# Patient Record
Sex: Male | Born: 1960 | Race: White | Hispanic: No | Marital: Married
Health system: Southern US, Community
[De-identification: ages and names within clinical notes are randomized; demographics above are authoritative.]

## PROBLEM LIST (undated history)

## (undated) DIAGNOSIS — G039 Meningitis, unspecified: Secondary | ICD-10-CM

## (undated) DIAGNOSIS — K589 Irritable bowel syndrome without diarrhea: Secondary | ICD-10-CM

---

## 1999-10-24 ENCOUNTER — Encounter: Admission: RE | Admit: 1999-10-24 | Discharge: 1999-10-24 | Payer: Self-pay | Admitting: Internal Medicine

## 1999-10-24 ENCOUNTER — Encounter: Payer: Self-pay | Admitting: Internal Medicine

## 2004-10-26 ENCOUNTER — Ambulatory Visit (HOSPITAL_COMMUNITY): Admission: RE | Admit: 2004-10-26 | Discharge: 2004-10-26 | Payer: Self-pay | Admitting: Internal Medicine

## 2006-05-25 ENCOUNTER — Emergency Department (HOSPITAL_COMMUNITY): Admission: EM | Admit: 2006-05-25 | Discharge: 2006-05-25 | Payer: Self-pay | Admitting: Emergency Medicine

## 2009-01-09 ENCOUNTER — Emergency Department (HOSPITAL_COMMUNITY): Admission: EM | Admit: 2009-01-09 | Discharge: 2009-01-09 | Payer: Self-pay | Admitting: Emergency Medicine

## 2011-04-16 LAB — CBC
HCT: 44.4 % (ref 39.0–52.0)
Hemoglobin: 14.7 g/dL (ref 13.0–17.0)
MCHC: 33.1 g/dL (ref 30.0–36.0)
MCV: 95.5 fL (ref 78.0–100.0)
Platelets: 145 10*3/uL — ABNORMAL LOW (ref 150–400)
RBC: 4.65 MIL/uL (ref 4.22–5.81)
RDW: 13.2 % (ref 11.5–15.5)
WBC: 6.8 10*3/uL (ref 4.0–10.5)

## 2011-04-16 LAB — BASIC METABOLIC PANEL
BUN: 9 mg/dL (ref 6–23)
CO2: 25 mEq/L (ref 19–32)
Calcium: 9.6 mg/dL (ref 8.4–10.5)
Chloride: 103 mEq/L (ref 96–112)
Creatinine, Ser: 0.97 mg/dL (ref 0.4–1.5)
GFR calc Af Amer: >60 mL/min (ref >60)
GFR calc non Af Amer: >60 mL/min (ref >60)
Glucose, Bld: 108 mg/dL — ABNORMAL HIGH (ref 70–99)
Potassium: 3.5 mEq/L (ref 3.5–5.1)
Sodium: 137 mEq/L (ref 135–145)

## 2011-04-16 LAB — DIFFERENTIAL
Basophils Absolute: 0.1 10*3/uL (ref 0.0–0.1)
Eosinophils Relative: 2 % (ref 0–5)
Lymphocytes Relative: 35 % (ref 12–46)
Lymphs Abs: 2.4 10*3/uL (ref 0.7–4.0)
Monocytes Absolute: 0.6 10*3/uL (ref 0.1–1.0)

## 2011-05-18 NOTE — H&P (Signed)
NAME:  Jason Richardson, Jason Richardson NO.:  [id number]   MEDICAL RECORD NO.:  [id number]          PATIENT TYPE:  EMS   LOCATION:  MAJO                         FACILITY:  [facility]   PHYSICIAN:  Hal T. Stoneking, M.D. DATE OF BIRTH:  10-13-1961   DATE OF ADMISSION:  05/25/2006  DATE OF DISCHARGE:                                HISTORY & PHYSICAL   HISTORY OF PRESENT ILLNESS:  Mr. Jason Richardson is a very nice 50 year old white  male patient of Dr. Kirby Richardson.  He has been in excellent health.  He has  a history he states in the past of having had a 2-D echocardiogram done and  they told him that one of his valves did not shut all the way.  There was  no needed followup on this particular matter.  He states that he has had  symptoms apparently which have not been related to his position in over the  last couple of years he notices when he mows the lawn he would get  discomfort in his left arm.  He will wait maybe 30 seconds, stop what he is  doing and the discomfort will go away.  Then recently he noted an episode  yesterday when he was walking along side the table and later found himself  sitting on the table and had no recollection of how he got to that position.  He had no fall.  He had no prior warning of rapid heart action,  palpitations, or chest pain at the time.  He says he has felt occasionally a  little lightheaded and when that occurs he has broken out in a sweat.  He  also complains of a periodic chest pressure although it is not clear as to  whether or not this was produced by exertion.  He comes to the ER because of  the episode yesterday and the chest pressure off an on today.  Currently, he  has no chest pain or dizziness.   PAST MEDICAL HISTORY:  No known drug allergies.   CURRENT MEDICATIONS:  None other than occasional Tylenol.   PAST MEDICAL HISTORY:  Negative for hypertension, diabetes and cancer.  Question history of valvular regurgitation.  He has had no prior  surgeries.   FAMILY HISTORY:  Remarkable for his mother, age 63, who has glaucoma.  She  had an MI at age 94 and now is treated for CHF.  Father had an MI at age 60  although he has lost contact with his father over the last year.  He has two  sisters who are in good health although they are overweight.   SOCIAL HISTORY:  Mr. Jason Richardson is single.  He has no children.  He works at  [employer].  He does not smoke.  He does not consume alcohol.   REVIEW OF SYSTEMS:  He occasionally has a headache for which he takes  Tylenol but no headache at the present time.  No change in vision, no  shortness of breath.  No abdominal pain.  No change in bowel or urinary  habits.  PHYSICAL EXAMINATION:  VITAL SIGNS:  Blood pressure 127/75, temperature  98.5, pulse 65 and regular, respiratory rate 20, O2 saturation 98%.  HEENT:  Unremarkable.  LUNGS:  Clear.  HEART:  Regular rate and rhythm without murmurs, rubs or gallops.  ABDOMEN:  Soft, no hepatosplenomegaly or pain.   LABORATORY DATA:  White count 4200, hemoglobin 14.2, platelet count 139,000.  Troponin less than 1.  CK reported out as negative with negative MB although  absolute values not currently available.  Sodium 139, potassium __________ ,  BUN 9, __________ , chloride 105.  EKG revealed incomplete right bundle  branch block, question new or old.   ASSESSMENT:  Subacute history of exertional left arm pain and occasional  question of exertional chest pressure, now with lightheadedness and  diaphoresis.  This amnestic episode, doubt that this was a true syncopal  episode as he had no evidence of fall or trauma.  There is, however, a  family history of heart disease and one would wonder whether or not he has  had some underlying coronary disease.  We will start on aspirin 325 mg once  a day and have cardiology evaluate to help [place] decide the most appropriate  manner to further investigate this  problem.           ______________________________  Sunday Spillers Pete Glatter, M.D.     HTS/MEDQ  D:  05/25/2006  T:  05/25/2006  Job:  [id number]

## 2011-05-18 NOTE — Consult Note (Signed)
NAME:  Jason Richardson, Jason Richardson NO.:  [id number]   MEDICAL RECORD NO.:  [id number]          PATIENT TYPE:  OBV   LOCATION:  1829                         FACILITY:  [facility]   PHYSICIAN:  Peter M. Swaziland, M.D.  DATE OF BIRTH:  December 09, 1961   DATE OF CONSULTATION:  05/25/2006  DATE OF DISCHARGE:                                   CONSULTATION   Mr. Jason Richardson is a pleasant 51 year old white male in generally excellent  health who presents to emergency room department complaining of some chest  pressure, lightheadedness today.  The patient describes a mid substernal  chest pressure off and on for the past year.  This is not exertionally  related.  He feels that if he could just take a good belch it would resolve  and in fact has resolved previously with belching. 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  He is  currently without symptoms.  He has no nausea, vomiting, diaphoresis, or  shortness of breath.  He does have a history of acid reflux disease,  apparently diagnosed by upper GI series in the past.   PAST MEDICAL HISTORY:  1.  GERD.  He has no prior history of hypertension, hypercholesterolemia or      diabetes.  2.  He is on no medications and has no known allergies.   SOCIAL HISTORY:  The patient is single, has no children.  He denies history  of tobacco or alcohol use.  He works in the Environmental consultant at [employer].   FAMILY HISTORY:  Mother has a history of lymphoma, pulmonary fibrosis and  cardiomyopathy related to chemotherapy.  His father apparently has some  history of heart disease in his 17s but sone is currently estranged from his  father and does not know his history well.   REVIEW OF SYSTEMS:  Otherwise unremarkable.  He has no orthopnea, PND, edema  and has had no recent travel.  No fever.  Denies any change in bowel or  bladder  habits.   PHYSICAL EXAM:  GENERAL:  Patient is a pleasant thin white male in no  apparent distress.  VITAL SIGNS:  Blood pressure 127/75, pulse 68, respirations 20, afebrile.  HEENT:  Normocephalic, atraumatic.  His pupils equal, round, reactive.  Sclerae clear.  Oropharynx is clear.  Neck is without JVD, adenopathy,  thyromegaly or bruits.  LUNGS:  Clear to auscultation and percussion.  CARDIAC:  Exam reveals regular rate and rhythm without gallop, murmur, rub  or click. 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  ABDOMEN:  Soft, nontender without as splenomegaly, masses or bruits.  Femoral and pedal pulses were 3+ and symmetric.  He had no phlebitis or  edema.  NEUROLOGIC:  Exam is nonfocal.   LABORATORY DATA:  CHEST X-RAY:  Shows hyperinflation with no active disease.  ECG shows sinus rhythm with incomplete right bundle branch block, otherwise  normal.   Point of care cardiac enzymes are negative x3. White count 4200,  hemoglobin  14.2, hematocrit 42.7, platelets 139,000.  Sodium is 139, potassium 3.8,  chloride 105, CO2 31, BUN 9, creatinine 1.0, glucose of 102.   IMPRESSION:  1.  Atypical chest pain with mild lightheadedness.  2.  GERD.   PLAN:  I think it would be prudent to let the patient go home today.  I  recommend that he take a baby aspirin per day.  May try taking Zantac or  Pepcid over-the-counter as needed.  We will arrange to come back to my  office later this week for a stress echo evaluation.  He is instructed if he  has any worsening chest pain that he come back emergency room this week end.           ______________________________  Peter M. [place], M.D.     PMJ/MEDQ  D:  05/25/2006  T:  05/27/2006  Job:  [id number]   cc:   Hal T. Stoneking, M.D.  Fax: [phone]   Thora Lance, M.D.  Fax: [phone]

## 2011-05-18 NOTE — Discharge Summary (Signed)
NAME:  Jason Richardson, Jason Richardson NO.:  [id number]   MEDICAL RECORD NO.:  [id number]          PATIENT TYPE:  EMS   LOCATION:  MAJO                         FACILITY:  [facility]   PHYSICIAN:  Hal T. Stoneking, M.D. DATE OF BIRTH:  1961/08/08   DATE OF ADMISSION:  05/25/2006  DATE OF DISCHARGE:  05/25/2006                                 DISCHARGE SUMMARY   ADMISSION DIAGNOSIS:  For 24-hour observation, chest pain, question  etiology.   DISCHARGE DIAGNOSES:  Chest pain, possibly gastroesophageal reflux disease.   BRIEF HISTORY:  Mr. [name] is a very nice 50 year old white male, patient of  Dr. Kirby Funk, who presented top the [facility] Emergency Room on May 25, 2006, with periodic chest pain and occasionally some episodes of feeling  lightheaded and breaking out into a sweat.   PHYSICAL EXAMINATION:  Entirely normal.   LABORATORY DATA:  Hemoglobin 14.2, white count 4200, platelet count 139,000.  Point-of-care cardiac enzymes were negative x3.  Sodium 139, potassium 3.8,  chloride 105, BUN 9, creatinine 1, glucose 102.   EKG showed sinus rhythm, incomplete right bundle branch block.   Chest x-ray showed hyperinflation, no active disease.   HOSPITAL COURSE:  The patient was admitted for 24-hour observation and  cardiac consultation.  He was seen in the emergency room by Dr. Peter  Swaziland, and, after further evaluation, Dr. [place]iland felt that this was indeed  atypical chest pain, possibly due to GERD, but felt that he should be  discharged home on a baby aspirin a day, over-the-counter Zantac or Pepcid  as needed, and Dr. Sw[place]iland will be seeing him in his clinic to set up a  stress echocardiogram evaluation to rule out the possibility of coronary  ischemia.           ______________________________  Hal T. Pete Glatter, M.D.     HTS/MEDQ  D:  05/28/2006  T:  05/28/2006  Job:  [id number]

## 2012-07-17 ENCOUNTER — Emergency Department (HOSPITAL_COMMUNITY)
Admission: EM | Admit: 2012-07-17 | Discharge: 2012-07-17 | Disposition: A | Discharge status: Home / Self Care | Payer: 59 | Attending: Emergency Medicine | Admitting: Emergency Medicine

## 2012-07-17 ENCOUNTER — Encounter (HOSPITAL_COMMUNITY): Payer: Self-pay | Admitting: Emergency Medicine

## 2012-07-17 DIAGNOSIS — R5383 Other fatigue: Secondary | ICD-10-CM

## 2012-07-17 DIAGNOSIS — K589 Irritable bowel syndrome without diarrhea: Secondary | ICD-10-CM | POA: Insufficient documentation

## 2012-07-17 DIAGNOSIS — R5381 Other malaise: Secondary | ICD-10-CM | POA: Insufficient documentation

## 2012-07-17 HISTORY — DX: Irritable bowel syndrome without diarrhea: K58.9

## 2012-07-17 LAB — DIFFERENTIAL
Basophils Absolute: 0 10*3/uL (ref 0.0–0.1)
Basophils Relative: 1 % (ref 0–1)
Eosinophils Absolute: 0.1 10*3/uL (ref 0.0–0.7)
Eosinophils Relative: 1 % (ref 0–5)
Lymphocytes Relative: 14 % (ref 12–46)
Lymphs Abs: 1.1 10*3/uL (ref 0.7–4.0)
Monocytes Absolute: 0.4 10*3/uL (ref 0.1–1.0)
Monocytes Relative: 5 % (ref 3–12)
Neutro Abs: 6.4 10*3/uL (ref 1.7–7.7)
Neutrophils Relative %: 79 % — ABNORMAL HIGH (ref 43–77)

## 2012-07-17 LAB — COMPREHENSIVE METABOLIC PANEL
ALT: 26 U/L (ref 0–53)
AST: 34 U/L (ref 0–37)
Albumin: 4 g/dL (ref 3.5–5.2)
Alkaline Phosphatase: 52 U/L (ref 39–117)
BUN: 10 mg/dL (ref 6–23)
CO2: 25 mEq/L (ref 19–32)
Calcium: 9 mg/dL (ref 8.4–10.5)
Chloride: 105 mEq/L (ref 96–112)
Creatinine, Ser: 0.86 mg/dL (ref 0.50–1.35)
GFR calc Af Amer: >90 mL/min (ref >90)
GFR calc non Af Amer: >90 mL/min (ref >90)
Glucose, Bld: 99 mg/dL (ref 70–99)
Potassium: 4.1 mEq/L (ref 3.5–5.1)
Sodium: 140 mEq/L (ref 135–145)
Total Bilirubin: 0.5 mg/dL (ref 0.3–1.2)
Total Protein: 6.8 g/dL (ref 6.0–8.3)

## 2012-07-17 LAB — URINALYSIS, ROUTINE W REFLEX MICROSCOPIC
Bilirubin Urine: NEGATIVE
Glucose, UA: NEGATIVE mg/dL
Hgb urine dipstick: NEGATIVE
Ketones, ur: NEGATIVE mg/dL
Leukocytes, UA: NEGATIVE
Nitrite: NEGATIVE
Protein, ur: NEGATIVE mg/dL
Specific Gravity, Urine: 1.011 (ref 1.005–1.030)
Urobilinogen, UA: 1 mg/dL (ref 0.0–1.0)
pH: 8.5 — ABNORMAL HIGH (ref 5.0–8.0)

## 2012-07-17 LAB — GLUCOSE, CAPILLARY: Glucose-Capillary: 79 mg/dL (ref 70–99)

## 2012-07-17 LAB — CBC
HCT: 40.2 % (ref 39.0–52.0)
MCHC: 34.6 g/dL (ref 30.0–36.0)
MCV: 91.8 fL (ref 78.0–100.0)
RDW: 13.1 % (ref 11.5–15.5)

## 2012-07-17 MED ORDER — SODIUM CHLORIDE 0.9 % IV SOLN
1000.0000 mL | Freq: Once | INTRAVENOUS | Status: AC
  Administered 2012-07-17: 1000 mL via INTRAVENOUS

## 2012-07-17 MED ORDER — SODIUM CHLORIDE 0.9 % IV SOLN
1000.0000 mL | INTRAVENOUS | Status: DC
  Administered 2012-07-17: 1000 mL via INTRAVENOUS

## 2012-07-17 NOTE — ED Provider Notes (Signed)
History     CSN: [id number]  Arrival date & time 07/17/12  1022   First MD Initiated Contact with Patient 07/17/12 1024      Chief Complaint  Patient presents with  . Fatigue  . Abdominal Pain    (Consider location/radiation/quality/duration/timing/severity/associated sxs/prior treatment) HPI Comments: Pt was at work today when he suddenly started feeling weak and lightheaded.  He denies chest pain.  He did feel like his neck was a little stiff this am when he woke up.  He had not been doing anything strenuous.  He felt felt very sweaty.  He did not feel hot.  Pt bent over and he felt an ache in his abdomen.  He has history of Acid reflux problems and IBS.  He has had workup with colonscopy and endoscopy (which were normal)  Patient is a 51 y.o. male presenting with weakness.  Weakness The primary symptoms include dizziness. Primary symptoms do not include vomiting. The symptoms began less than 1 hour ago. The symptoms are improving. The neurological symptoms are diffuse.  He describes the dizziness as lightheadedness. The dizziness has been gradually improving since its onset. It is a new problem. Dizziness also occurs with weakness and diaphoresis. Dizziness does not occur with blurred vision or vomiting.  Additional symptoms include weakness.    Past Medical History  Diagnosis Date  . IBS (irritable bowel syndrome)     No past surgical history on file.  No family history on file.  History  Substance Use Topics  . Smoking status: Not on file  . Smokeless tobacco: Not on file  . Alcohol Use:       Review of Systems  Constitutional: Positive for diaphoresis.  Eyes: Negative for blurred vision.  Gastrointestinal: Negative for vomiting and diarrhea.  Genitourinary: Negative for dysuria.  Neurological: Positive for dizziness and weakness.  Psychiatric/Behavioral: Negative for confusion.  All other systems reviewed and are negative.    Allergies  Review of patient's  allergies indicates not on file.  Home Medications  No current outpatient prescriptions on file.  SpO2 96%  Physical Exam  Nursing note and vitals reviewed. Constitutional: No distress.       thin  HENT:  Head: Normocephalic and atraumatic.  Right Ear: External ear normal.  Left Ear: External ear normal.  Eyes: Conjunctivae are normal. Right eye exhibits no discharge. Left eye exhibits no discharge. No scleral icterus.  Neck: Neck supple. No tracheal deviation present.  Cardiovascular: Normal rate, regular rhythm and intact distal pulses.   Pulmonary/Chest: Effort normal and breath sounds normal. No stridor. No respiratory distress. He has no wheezes. He has no rales.  Abdominal: Soft. Bowel sounds are normal. He exhibits no distension. There is no tenderness. There is no rebound and no guarding.  Musculoskeletal: He exhibits no edema and no tenderness.  Neurological: He is alert. He has normal strength. No sensory deficit. Cranial nerve deficit:  no gross defecits noted. He exhibits normal muscle tone. He displays no seizure activity. Coordination normal.  Skin: Skin is warm and dry. No rash noted.  Psychiatric: He has a normal mood and affect.    ED Course  Procedures (including critical care time) EKG Rate 49 AGE IS NOT ENTERED, ASSUMED TO BE 51 YEARS OLD FOR PURPOSE OF ECG INTERPRETATION SINUS RHYTHM ~ normal P axis, V-rate 50- 99 BORDERLINE AV CONDUCTION DELAY ~ PR >200, V-rate 50- 90 PROBABLE LEFT ATRIAL ABNORMALITY ~ P >12mS, <-0.76mV V1 INCOMPLETE RIGHT BUNDLE BRANCH BLOCK ~ QRSd >105,  terminal axis(90,270) No sig change compared to previous Labs Reviewed  CBC - Abnormal; Notable for the following:    Platelets 138 (*)     All other components within normal limits  DIFFERENTIAL - Abnormal; Notable for the following:    Neutrophils Relative 79 (*)     All other components within normal limits  URINALYSIS, ROUTINE W REFLEX MICROSCOPIC - Abnormal; Notable for the  following:    pH 8.5 (*)     All other components within normal limits  COMPREHENSIVE METABOLIC PANEL  LIPASE, BLOOD  GLUCOSE, CAPILLARY  POCT CBG (FASTING - GLUCOSE)-MANUAL ENTRY   No results found.   1. Fatigue       MDM  Patient had a near syncopal episode at work. After IV fluid hydration he has improved. Patient states he was working out in the heat it is possible this was related. At this time does not appear to be evidence of an acute emergency medical condition. I doubt cardiac etiology. He does not appear to be anemic and there is no signs of acute infection. I recommend patient follow up with his primary care Dr. Consider further outpatient workup such as echocardiogram and Holter monitor testing. Patient encouraged to return to emergency room for any worsening symptoms        Celene Kras, MD 07/17/12 1349

## 2012-07-17 NOTE — ED Notes (Signed)
Patient at work standing sudden onset of weakness pale abdominal pain, and neck pain.  History of IBS CBG 91.

## 2012-12-01 ENCOUNTER — Other Ambulatory Visit: Payer: Self-pay | Admitting: Internal Medicine

## 2012-12-01 DIAGNOSIS — R1013 Epigastric pain: Secondary | ICD-10-CM

## 2012-12-05 ENCOUNTER — Ambulatory Visit
Admission: RE | Admit: 2012-12-05 | Discharge: 2012-12-05 | Disposition: A | Discharge status: Home / Self Care | Payer: 59 | Source: Ambulatory Visit | Attending: Internal Medicine | Admitting: Internal Medicine

## 2012-12-05 DIAGNOSIS — R1013 Epigastric pain: Secondary | ICD-10-CM

## 2012-12-05 MED ORDER — IOHEXOL 300 MG/ML  SOLN
100.0000 mL | Freq: Once | INTRAMUSCULAR | Status: AC | PRN
  Administered 2012-12-05: 100 mL via INTRAVENOUS

## 2013-11-01 IMAGING — CT CT ABDOMEN W/ CM
3 of 5 series · 13 of 36 positions shown, 19 images · IV contrast (READICAT/WATER & [ID] OMNI 300)
Comparison: None.

CLINICAL DATA: Epigastric abdominal pain, diarrhea, possible
irritable bowel syndrome

CT ABDOMEN WITH CONTRAST
TECHNIQUE: Multidetector CT imaging of the abdomen was performed
following the standard protocol during bolus administration of
intravenous contrast.
Contrast: 100mL OMNIPAQUE IOHEXOL 300 MG/ML  SOLN

[Series 3: abdomen with · axial · 0.70mm/px · z∈[-301,-111]mm · 5 of 58 slices shown, 10 images]
[im 10/58  soft-tissue]
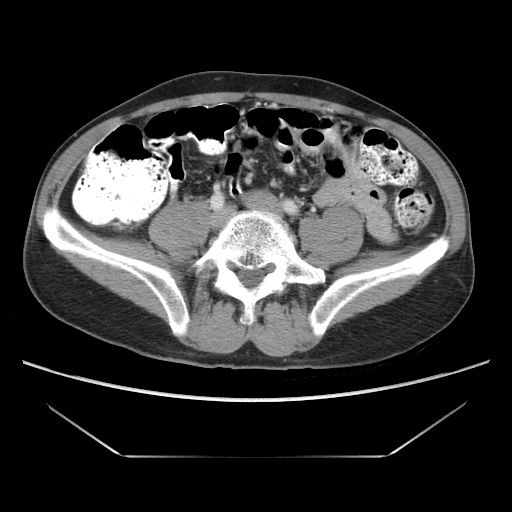
[im 10/58  bone]
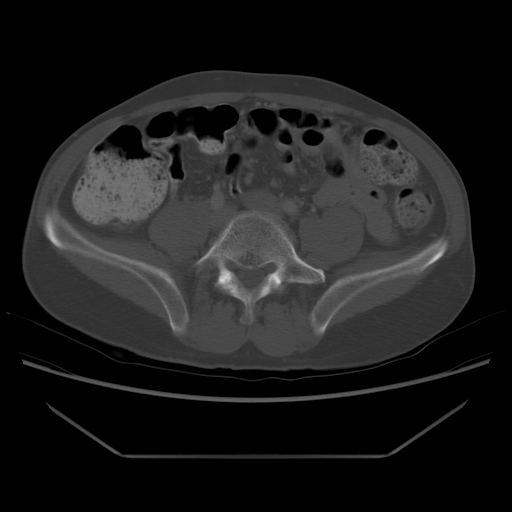
[im 20/58  soft-tissue]
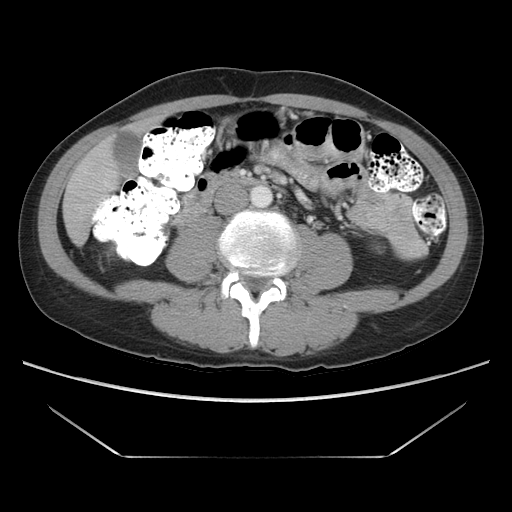
[im 20/58  lung]
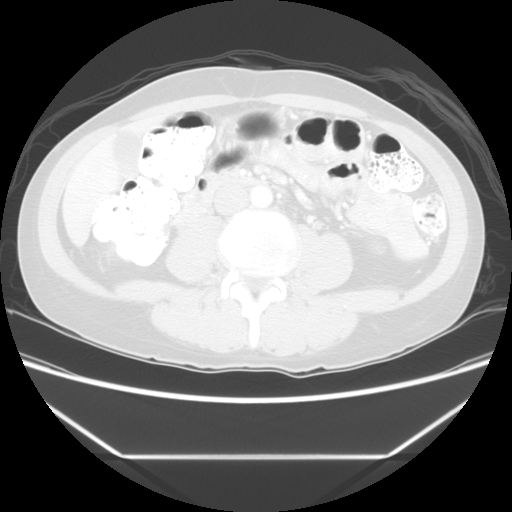
[im 29/58  soft-tissue]
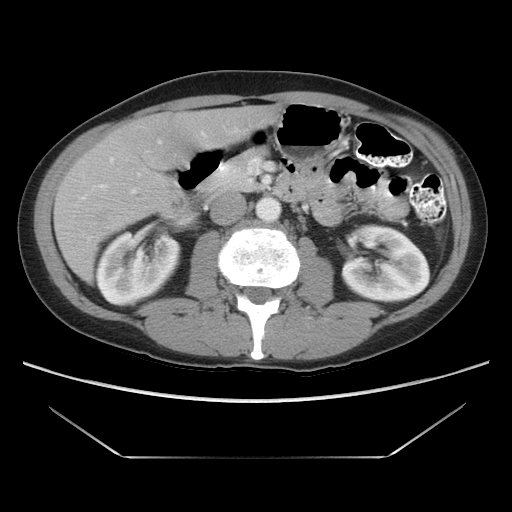
[im 29/58  lung]
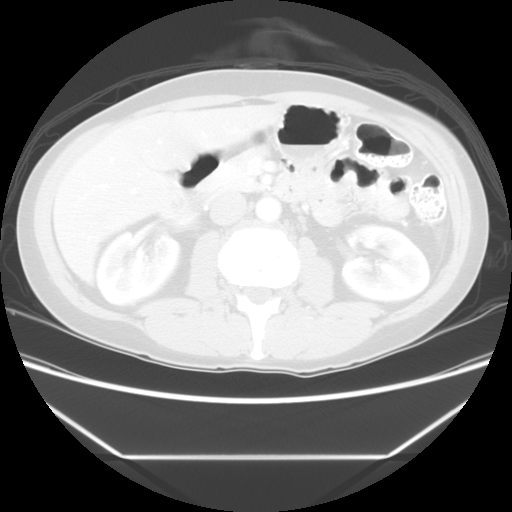
[im 39/58  soft-tissue]
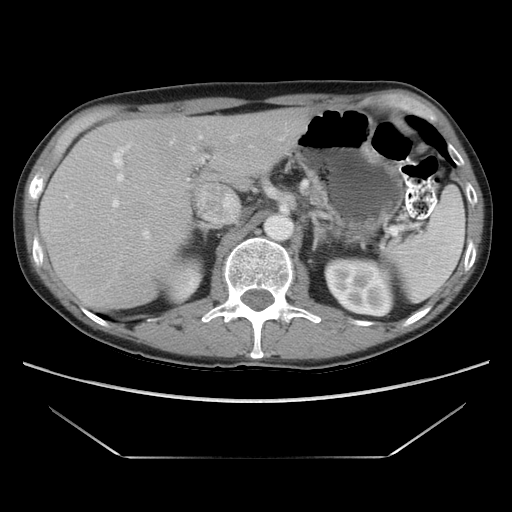
[im 39/58  lung]
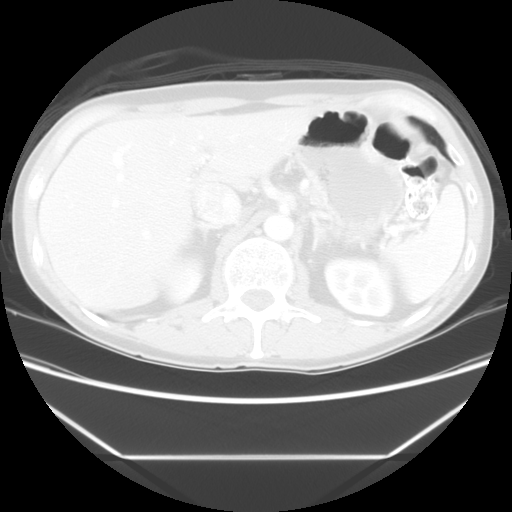
[im 48/58  soft-tissue]
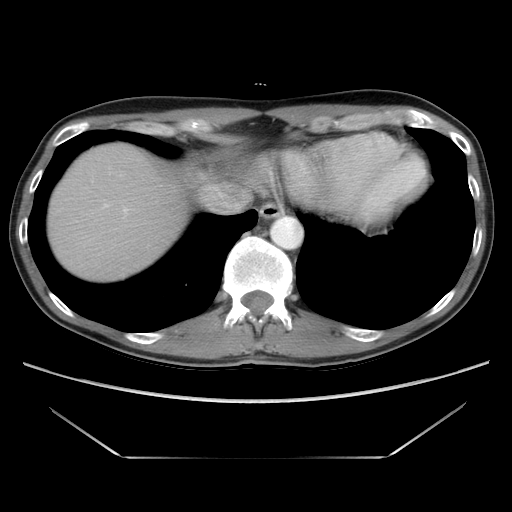
[im 48/58  lung]
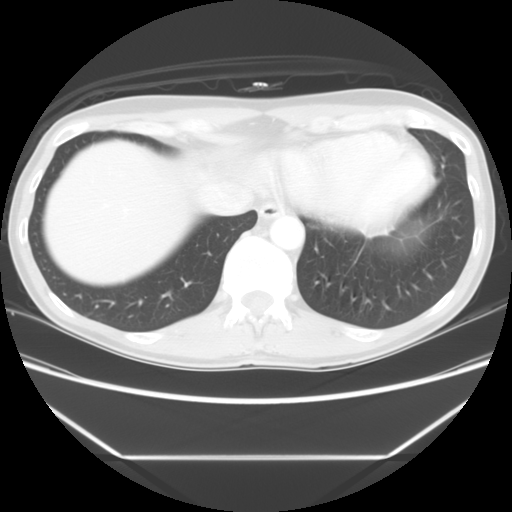

[Series 601: coronal body · coronal · 0.70mm/px · 1 of 92 slices shown, 2 images]
[im 31/92  soft-tissue]
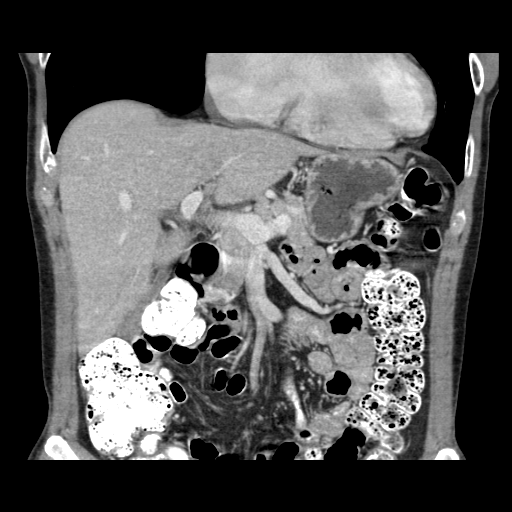
[im 31/92  bone]
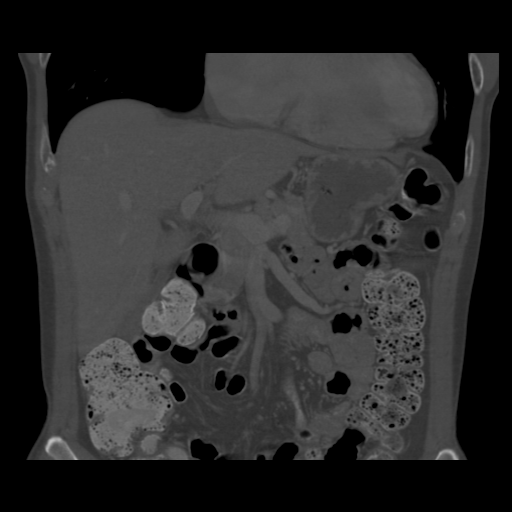

[Series 602: sagittal body · sagittal · 0.70mm/px · 7 of 144 slices shown]
[im 16/144  soft-tissue]
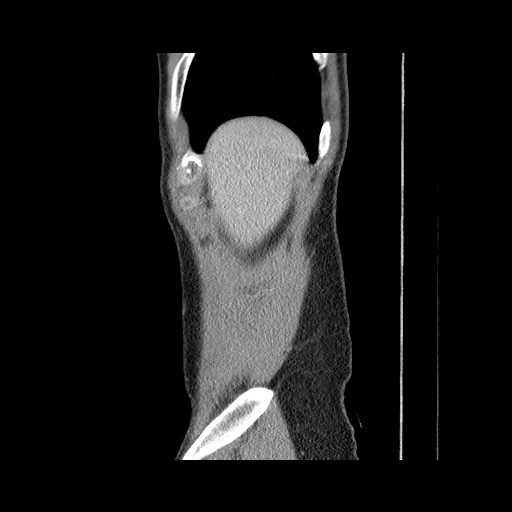
[im 32/144  soft-tissue]
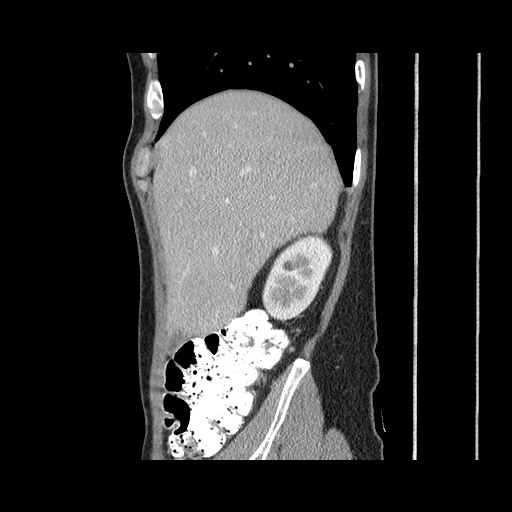
[im 48/144  soft-tissue]
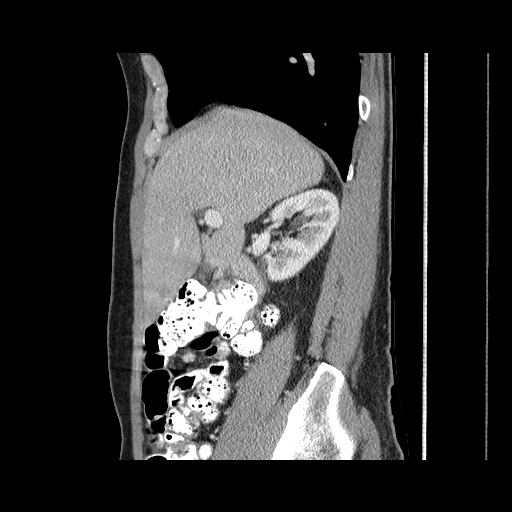
[im 64/144  soft-tissue]
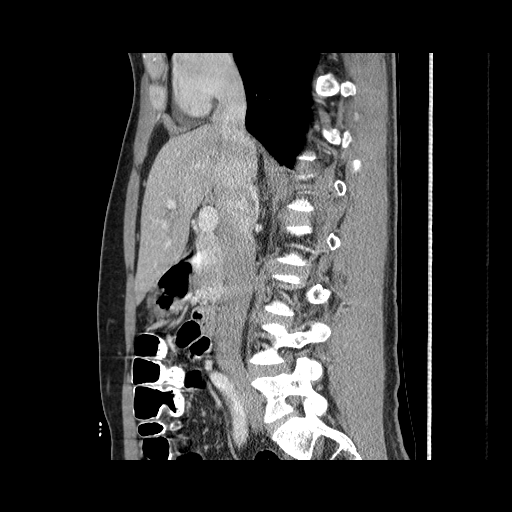
[im 80/144  soft-tissue]
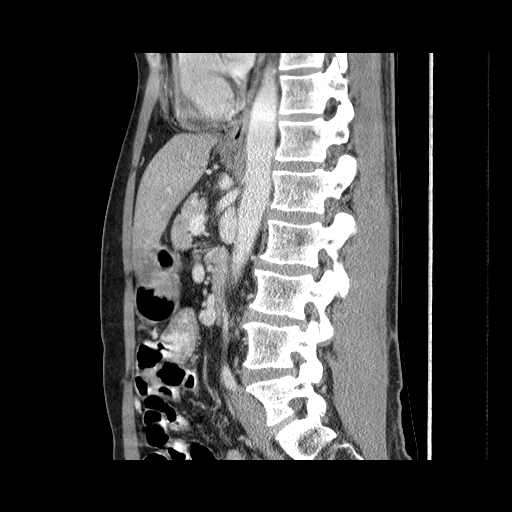
[im 96/144  soft-tissue]
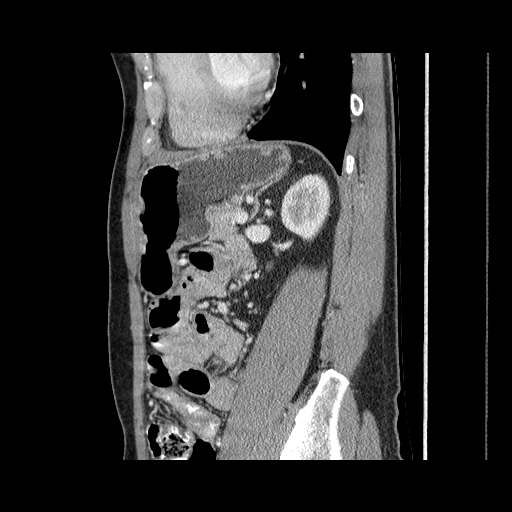
[im 112/144  soft-tissue]
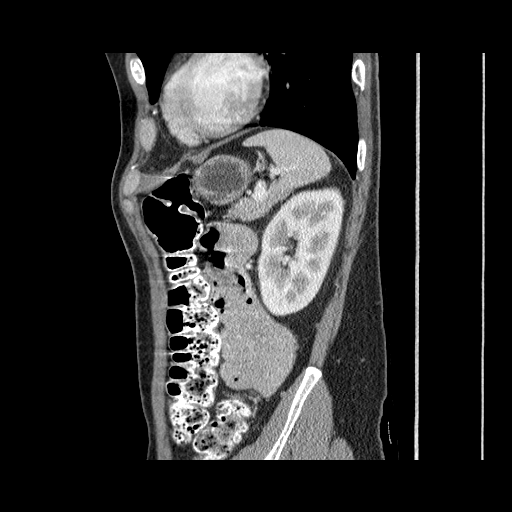

[13 of 36 positions shown; findings below may reference images not displayed]

FINDINGS: The lung bases are clear.  There is a pectus deformity
present. The normal Charbel index is approximately 2.5 ratio of
chest width and depth at the level of the heart, with the current
ratio being 4.25.  The liver enhances with no focal abnormality and
no ductal dilatation is seen.  No calcified gallstones are noted.
The pancreas is normal in size and the pancreatic duct is not
dilated.  The adrenal glands and spleen are unremarkable.  The
stomach is moderately fluid distended with no gross abnormality.
The kidneys enhance with no calculus or mass and no hydronephrosis
is seen on delayed images the pelvocaliceal systems are
unremarkable.  A probable tiny right mid lower renal cyst is noted.
The abdominal aorta is normal in caliber.  No adenopathy is seen.
No abnormality of the bowel that is visualized is noted.  No bony
abnormality is seen.
IMPRESSION: 1.  No explanation for the patient's pain is seen.
2.  Pectus deformity.

## 2016-05-10 DIAGNOSIS — H5213 Myopia, bilateral: Secondary | ICD-10-CM | POA: Diagnosis not present

## 2016-05-10 DIAGNOSIS — H52203 Unspecified astigmatism, bilateral: Secondary | ICD-10-CM | POA: Diagnosis not present

## 2016-06-04 DIAGNOSIS — B370 Candidal stomatitis: Secondary | ICD-10-CM | POA: Diagnosis not present

## 2016-06-04 DIAGNOSIS — K5900 Constipation, unspecified: Secondary | ICD-10-CM | POA: Diagnosis not present

## 2016-10-01 DIAGNOSIS — R198 Other specified symptoms and signs involving the digestive system and abdomen: Secondary | ICD-10-CM | POA: Diagnosis not present

## 2016-12-07 DIAGNOSIS — E7800 Pure hypercholesterolemia, unspecified: Secondary | ICD-10-CM | POA: Diagnosis not present

## 2016-12-07 DIAGNOSIS — R3914 Feeling of incomplete bladder emptying: Secondary | ICD-10-CM | POA: Diagnosis not present

## 2016-12-07 DIAGNOSIS — Z0000 Encounter for general adult medical examination without abnormal findings: Secondary | ICD-10-CM | POA: Diagnosis not present

## 2016-12-07 DIAGNOSIS — Z125 Encounter for screening for malignant neoplasm of prostate: Secondary | ICD-10-CM | POA: Diagnosis not present

## 2016-12-07 DIAGNOSIS — N401 Enlarged prostate with lower urinary tract symptoms: Secondary | ICD-10-CM | POA: Diagnosis not present

## 2016-12-07 DIAGNOSIS — K589 Irritable bowel syndrome without diarrhea: Secondary | ICD-10-CM | POA: Diagnosis not present

## 2017-01-14 DIAGNOSIS — M436 Torticollis: Secondary | ICD-10-CM | POA: Diagnosis not present

## 2017-01-14 DIAGNOSIS — R42 Dizziness and giddiness: Secondary | ICD-10-CM | POA: Diagnosis not present

## 2017-05-23 DIAGNOSIS — R194 Change in bowel habit: Secondary | ICD-10-CM | POA: Diagnosis not present

## 2017-05-23 DIAGNOSIS — R634 Abnormal weight loss: Secondary | ICD-10-CM | POA: Diagnosis not present

## 2017-05-23 DIAGNOSIS — R197 Diarrhea, unspecified: Secondary | ICD-10-CM | POA: Diagnosis not present

## 2017-05-23 DIAGNOSIS — R140 Abdominal distension (gaseous): Secondary | ICD-10-CM | POA: Diagnosis not present

## 2017-05-24 DIAGNOSIS — R194 Change in bowel habit: Secondary | ICD-10-CM | POA: Diagnosis not present

## 2017-07-08 DIAGNOSIS — K640 First degree hemorrhoids: Secondary | ICD-10-CM | POA: Diagnosis not present

## 2017-07-08 DIAGNOSIS — Z86010 Personal history of colonic polyps: Secondary | ICD-10-CM | POA: Diagnosis not present

## 2017-07-08 DIAGNOSIS — K5730 Diverticulosis of large intestine without perforation or abscess without bleeding: Secondary | ICD-10-CM | POA: Diagnosis not present

## 2017-07-08 DIAGNOSIS — D126 Benign neoplasm of colon, unspecified: Secondary | ICD-10-CM | POA: Diagnosis not present

## 2017-07-09 DIAGNOSIS — D126 Benign neoplasm of colon, unspecified: Secondary | ICD-10-CM | POA: Diagnosis not present

## 2017-12-05 DIAGNOSIS — K589 Irritable bowel syndrome without diarrhea: Secondary | ICD-10-CM | POA: Diagnosis not present

## 2017-12-05 DIAGNOSIS — Z0184 Encounter for antibody response examination: Secondary | ICD-10-CM | POA: Diagnosis not present

## 2017-12-05 DIAGNOSIS — Z0000 Encounter for general adult medical examination without abnormal findings: Secondary | ICD-10-CM | POA: Diagnosis not present

## 2018-06-23 ENCOUNTER — Emergency Department (HOSPITAL_COMMUNITY): Payer: BLUE CROSS/BLUE SHIELD

## 2018-06-23 ENCOUNTER — Encounter (HOSPITAL_COMMUNITY): Payer: Self-pay | Admitting: Emergency Medicine

## 2018-06-23 ENCOUNTER — Emergency Department (HOSPITAL_COMMUNITY)
Admission: EM | Admit: 2018-06-23 | Discharge: 2018-06-23 | Disposition: A | Discharge status: Home / Self Care | Payer: BLUE CROSS/BLUE SHIELD | Attending: Emergency Medicine | Admitting: Emergency Medicine

## 2018-06-23 DIAGNOSIS — Z79899 Other long term (current) drug therapy: Secondary | ICD-10-CM | POA: Insufficient documentation

## 2018-06-23 DIAGNOSIS — R42 Dizziness and giddiness: Secondary | ICD-10-CM | POA: Diagnosis not present

## 2018-06-23 DIAGNOSIS — R55 Syncope and collapse: Secondary | ICD-10-CM | POA: Insufficient documentation

## 2018-06-23 HISTORY — DX: Meningitis, unspecified: G03.9

## 2018-06-23 LAB — URINALYSIS, ROUTINE W REFLEX MICROSCOPIC
BILIRUBIN URINE: NEGATIVE
Glucose, UA: NEGATIVE mg/dL
Hgb urine dipstick: NEGATIVE
KETONES UR: 20 mg/dL — AB
Leukocytes, UA: NEGATIVE
NITRITE: NEGATIVE
PH: 8 (ref 5.0–8.0)
PROTEIN: NEGATIVE mg/dL
Specific Gravity, Urine: 1.014 (ref 1.005–1.030)

## 2018-06-23 LAB — I-STAT TROPONIN, ED
Troponin i, poc: 0.01 ng/mL (ref 0.00–0.08)
Troponin i, poc: 0 ng/mL (ref 0.00–0.08)

## 2018-06-23 LAB — BASIC METABOLIC PANEL
Anion gap: 9 (ref 5–15)
BUN: 5 mg/dL — AB (ref 6–20)
CALCIUM: 9.4 mg/dL (ref 8.9–10.3)
CO2: 28 mmol/L (ref 22–32)
Chloride: 99 mmol/L — ABNORMAL LOW (ref 101–111)
Creatinine, Ser: 0.83 mg/dL (ref 0.61–1.24)
GFR calc Af Amer: >60 mL/min (ref >60)
GLUCOSE: 99 mg/dL (ref 65–99)
Potassium: 4 mmol/L (ref 3.5–5.1)
SODIUM: 136 mmol/L (ref 135–145)

## 2018-06-23 LAB — CBC
HCT: 43.6 % (ref 39.0–52.0)
Hemoglobin: 14.5 g/dL (ref 13.0–17.0)
MCH: 32.2 pg (ref 26.0–34.0)
MCHC: 33.3 g/dL (ref 30.0–36.0)
MCV: 96.9 fL (ref 78.0–100.0)
PLATELETS: 157 10*3/uL (ref 150–400)
RBC: 4.5 MIL/uL (ref 4.22–5.81)
RDW: 12.2 % (ref 11.5–15.5)
WBC: 4.5 10*3/uL (ref 4.0–10.5)

## 2018-06-23 LAB — CBG MONITORING, ED: GLUCOSE-CAPILLARY: 107 mg/dL — AB (ref 65–99)

## 2018-06-23 MED ORDER — GADOBENATE DIMEGLUMINE 529 MG/ML IV SOLN
15.0000 mL | Freq: Once | INTRAVENOUS | Status: AC
  Administered 2018-06-23: 15 mL via INTRAVENOUS

## 2018-06-23 NOTE — ED Provider Notes (Signed)
Care assumed from [place], [place]. Presents after 2 near syncopal events today. Also with neck pain. Labs unremarkable. Plan discussed with neurology, will obtain MR brain, MRA head, MRA neck. If negative, can be d/c home with PCP f/u. First trop neg.   Plan: f/u scans, repeat troponin.   Course: repeat troponin negative. Imaging as above negative. Remains asymptomatic throughout ED course. D/c home in stable condition, return precautions discussed. Patient agreeable with plan for d/c home.   Clinical Impression:  Near syncope  Dispo: Discharge    Diannia Ruderucker, Latoi Giraldo, MD 06/24/18 0310    Melene PlanFloyd, Dan, DO 06/24/18 [phone]

## 2018-06-23 NOTE — ED Notes (Signed)
Harolyn RutherfordShawn Joy, PA at bedside at this time. Patient provided urinal for urine specimen.

## 2018-06-23 NOTE — ED Notes (Signed)
Denies dizziness

## 2018-06-23 NOTE — ED Notes (Signed)
Patient returned from MRI.

## 2018-06-23 NOTE — ED Triage Notes (Addendum)
Pt states around 0930 am he has had several near syncopal episodes Pt also presents with pain the left neck for 1 week. Denies chest pain or shortness of breath. Pt states bilateral legs feel weak. 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 Pt states he has intermittent feelings of lightheaded and like he is going to pass out. No visual disturbances.

## 2018-06-23 NOTE — ED Notes (Signed)
Patient transported to MRI 

## 2018-06-23 NOTE — ED Provider Notes (Signed)
[facility] EMERGENCY DEPARTMENT Provider Note   CSN: [id number] Arrival date & time: 06/23/18  1056     History   Chief Complaint Chief Complaint  Patient presents with  . Near Syncope  . Neck Pain    HPI Jason Richardson is a 57 y.o. male.  HPI   Jason Richardson is a 57 y.o. male, with a history of IBS, presenting to the ED with near syncope.  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 I sat down and felt better within about 3-4 minutes. When it happened again, I decided to come to the hospital."  Patient also complains of discomfort to the left side of his neck for the last week.  He has not felt this discomfort before.  States it feels like a pressure that intensifies when he turns his head to the right. Denies use of drugs, alcohol, or caffeine.  Denies unilateral weakness, numbness, LOC, headache, vision loss, chest pain, shortness of breath, abdominal pain, N/V/D, or any other complaints.   Past Medical History:  Diagnosis Date  . IBS (irritable bowel syndrome)   . Meningitis     There are no active problems to display for this patient.   History reviewed. No pertinent surgical history.      Home Medications    Prior to Admission medications   Medication Sig Start Date End Date Taking? Authorizing Provider  acetaminophen (TYLENOL) 325 MG tablet Take 325 mg by mouth every 6 (six) hours as needed for headache. For pain    Yes [provider]  Probiotic Product (PROBIOTIC DAILY PO) Take 1 tablet by mouth daily.   Yes [provider]  pseudoephedrine-acetaminophen (TYLENOL SINUS) 30-500 MG TABS Take 1 tablet by mouth every 4 (four) hours as needed. For allergies   Yes [provider]    Family History No family history on file.  Social History Social History   Tobacco Use  . Smoking status: Not on  file  Substance Use Topics  . Alcohol use: Not on file  . Drug use: Not on file     Allergies   Contrast media [iodinated diagnostic agents]   Review of Systems Review of Systems  Constitutional: Positive for diaphoresis. Negative for chills and fever.  Respiratory: Negative for shortness of breath.   Cardiovascular: Negative for chest pain.  Gastrointestinal: Negative for abdominal pain, diarrhea, nausea and vomiting.  Musculoskeletal: Positive for neck pain.  Neurological: Positive for syncope (near-syncope) and light-headedness. Negative for weakness, numbness and headaches.  All other systems reviewed and are negative.    Physical Exam Updated Vital Signs BP 131/82   Pulse (!) 57   Temp 98.6 F (37 C)   Resp 12   SpO2 100%   Physical Exam  Constitutional: He is oriented to person, place, and time. He appears well-developed and well-nourished. No distress.  HENT:  Head: Normocephalic and atraumatic.  Eyes: Pupils are equal, round, and reactive to light. Conjunctivae and EOM are normal.  Neck: Normal range of motion. Neck supple.  Cardiovascular: Normal rate, regular rhythm, normal heart sounds and intact distal pulses.  Pulmonary/Chest: Effort normal and breath sounds normal. No respiratory distress.  Abdominal: Soft. There is no tenderness. There is no guarding.  Musculoskeletal: He exhibits no edema or tenderness.  No tenderness to the left side of the neck.  The area in which patient states he has pain is directly over the left carotid.  No onset of symptoms with range of motion of the neck.  Lymphadenopathy:    He has no cervical adenopathy.  Neurological: He is alert and oriented to person, place, and time.  Sensation grossly intact in the extremities. No noted speech deficits. No aphasia. Patient handles oral secretions without difficulty. No noted swallowing defects.  Equal grip strength bilaterally. Strength 5/5 in the upper extremities. Strength 5/5 with  flexion and extension of the hips, knees, and ankles bilaterally.  Patellar DTRs 2+ bilaterally. Negative Romberg. No gait disturbance.  Coordination intact including heel to shin and finger to nose.  Cranial nerves III-XII grossly intact.  No facial droop.   Skin: Skin is warm and dry. He is not diaphoretic.  Psychiatric: He has a normal mood and affect. His behavior is normal.  Nursing note and vitals reviewed.    ED Treatments / Results  Labs (all labs ordered are listed, but only abnormal results are displayed) Labs Reviewed  BASIC METABOLIC PANEL - Abnormal; Notable for the following components:      Result Value   Chloride 99 (*)    BUN 5 (*)    All other components within normal limits  CBG MONITORING, ED - Abnormal; Notable for the following components:   Glucose-Capillary 107 (*)    All other components within normal limits  CBC  URINALYSIS, ROUTINE W REFLEX MICROSCOPIC  I-STAT TROPONIN, ED  I-STAT TROPONIN, ED    EKG EKG Interpretation  Date/Time:  Monday June 23 2018 12:19:50 EDT Ventricular Rate:  65 PR Interval:  174 QRS Duration: 102 QT Interval:  396 QTC Calculation: 411 R Axis:   88 Text Interpretation:  Sinus rhythm with marked sinus arrhythmia Possible Left atrial enlargement RSR' or QR pattern in V1 suggests right ventricular conduction delay Borderline ECG Confirmed by Loren RacerYelverton, David ([id number]) on 06/23/2018 3:20:43 PM   Radiology No results found.  Procedures Procedures (including critical care time)  Medications Ordered in ED Medications - No data to display   Initial Impression / Assessment and Plan / ED Course  I have reviewed the triage vital signs and the nursing notes.  Pertinent labs & imaging results that were available during my care of the patient were reviewed by me and considered in my medical decision making (see chart for details).  Clinical Course as of Jun 24 1599  Mon Jun 23, 2018  1535 Spoke with Dr. Amada JupiterKirkpatrick,  neurologist.  States that if patient has had an adverse reaction to contrast dye, then he would recommend getting MRI brain as well as MRAs of the head and neck.   [SJ]    Clinical Course User Index [SJ] Joy, Shawn C, PA-C    Patient presents with near syncopal episodes that occurred shortly prior to arrival.  Normal neuro exam.  EKG similar to previous.  Asymptomatic here in the ED.  End of shift patient care handoff report given to Diannia RuderLeah Tucker, EM resident.  Plan: Patient to receive MRI brain and MRAs of head and neck. If normal and continues to be symptom-free, patient can follow-up with PCP.   Vitals:   06/23/18 1156 06/23/18 1406 06/23/18 1415 06/23/18 1430  BP: 136/89 122/88 (!) 145/91 131/82  Pulse: 70 (!) 56 94 (!) 57  Resp: 18 16 15 12   Temp: 98.6 F (37 C)     SpO2: 100% 100% 95% 100%     Final Clinical Impressions(s) /  ED Diagnoses   Final diagnoses:  None    ED Discharge Orders    None       Concepcion Living 06/23/18 1600    Loren Racer, MD 06/24/18 [phone]

## 2018-06-26 DIAGNOSIS — R55 Syncope and collapse: Secondary | ICD-10-CM | POA: Diagnosis not present

## 2018-12-10 DIAGNOSIS — Z125 Encounter for screening for malignant neoplasm of prostate: Secondary | ICD-10-CM | POA: Diagnosis not present

## 2018-12-10 DIAGNOSIS — K589 Irritable bowel syndrome without diarrhea: Secondary | ICD-10-CM | POA: Diagnosis not present

## 2018-12-10 DIAGNOSIS — Z0000 Encounter for general adult medical examination without abnormal findings: Secondary | ICD-10-CM | POA: Diagnosis not present

## 2019-08-08 DIAGNOSIS — Z20828 Contact with and (suspected) exposure to other viral communicable diseases: Secondary | ICD-10-CM | POA: Diagnosis not present

## 2019-09-24 DIAGNOSIS — H5213 Myopia, bilateral: Secondary | ICD-10-CM | POA: Diagnosis not present

## 2019-09-25 DIAGNOSIS — Z7189 Other specified counseling: Secondary | ICD-10-CM | POA: Diagnosis not present

## 2019-09-25 DIAGNOSIS — Z20828 Contact with and (suspected) exposure to other viral communicable diseases: Secondary | ICD-10-CM | POA: Diagnosis not present

## 2019-12-16 DIAGNOSIS — K589 Irritable bowel syndrome without diarrhea: Secondary | ICD-10-CM | POA: Diagnosis not present

## 2019-12-16 DIAGNOSIS — Z0000 Encounter for general adult medical examination without abnormal findings: Secondary | ICD-10-CM | POA: Diagnosis not present

## 2020-02-10 DIAGNOSIS — Z20828 Contact with and (suspected) exposure to other viral communicable diseases: Secondary | ICD-10-CM | POA: Diagnosis not present

## 2020-02-10 DIAGNOSIS — Z7189 Other specified counseling: Secondary | ICD-10-CM | POA: Diagnosis not present

## 2020-02-23 DIAGNOSIS — Z20828 Contact with and (suspected) exposure to other viral communicable diseases: Secondary | ICD-10-CM | POA: Diagnosis not present

## 2020-02-23 DIAGNOSIS — Z7189 Other specified counseling: Secondary | ICD-10-CM | POA: Diagnosis not present

## 2020-04-05 DIAGNOSIS — Z03818 Encounter for observation for suspected exposure to other biological agents ruled out: Secondary | ICD-10-CM | POA: Diagnosis not present

## 2020-04-08 DIAGNOSIS — Z23 Encounter for immunization: Secondary | ICD-10-CM | POA: Diagnosis not present

## 2020-04-26 DIAGNOSIS — Z03818 Encounter for observation for suspected exposure to other biological agents ruled out: Secondary | ICD-10-CM | POA: Diagnosis not present

## 2020-08-11 DIAGNOSIS — Z03818 Encounter for observation for suspected exposure to other biological agents ruled out: Secondary | ICD-10-CM | POA: Diagnosis not present

## 2020-09-13 DIAGNOSIS — Z03818 Encounter for observation for suspected exposure to other biological agents ruled out: Secondary | ICD-10-CM | POA: Diagnosis not present

## 2020-09-27 DIAGNOSIS — H5213 Myopia, bilateral: Secondary | ICD-10-CM | POA: Diagnosis not present

## 2020-09-27 DIAGNOSIS — H33303 Unspecified retinal break, bilateral: Secondary | ICD-10-CM | POA: Diagnosis not present

## 2020-10-03 DIAGNOSIS — Z03818 Encounter for observation for suspected exposure to other biological agents ruled out: Secondary | ICD-10-CM | POA: Diagnosis not present

## 2020-10-26 DIAGNOSIS — Z03818 Encounter for observation for suspected exposure to other biological agents ruled out: Secondary | ICD-10-CM | POA: Diagnosis not present

## 2020-11-29 DIAGNOSIS — Z03818 Encounter for observation for suspected exposure to other biological agents ruled out: Secondary | ICD-10-CM | POA: Diagnosis not present

## 2020-12-16 DIAGNOSIS — Z125 Encounter for screening for malignant neoplasm of prostate: Secondary | ICD-10-CM | POA: Diagnosis not present

## 2020-12-16 DIAGNOSIS — Z0000 Encounter for general adult medical examination without abnormal findings: Secondary | ICD-10-CM | POA: Diagnosis not present

## 2021-01-02 DIAGNOSIS — Z03818 Encounter for observation for suspected exposure to other biological agents ruled out: Secondary | ICD-10-CM | POA: Diagnosis not present

## 2021-01-26 DIAGNOSIS — Z23 Encounter for immunization: Secondary | ICD-10-CM | POA: Diagnosis not present

## 2021-03-13 DIAGNOSIS — Z03818 Encounter for observation for suspected exposure to other biological agents ruled out: Secondary | ICD-10-CM | POA: Diagnosis not present

## 2021-04-10 DIAGNOSIS — Z03818 Encounter for observation for suspected exposure to other biological agents ruled out: Secondary | ICD-10-CM | POA: Diagnosis not present

## 2021-10-02 DIAGNOSIS — H5213 Myopia, bilateral: Secondary | ICD-10-CM | POA: Diagnosis not present

## 2021-10-02 DIAGNOSIS — H33303 Unspecified retinal break, bilateral: Secondary | ICD-10-CM | POA: Diagnosis not present

## 2022-01-02 DIAGNOSIS — Z23 Encounter for immunization: Secondary | ICD-10-CM | POA: Diagnosis not present

## 2022-01-02 DIAGNOSIS — Z0000 Encounter for general adult medical examination without abnormal findings: Secondary | ICD-10-CM | POA: Diagnosis not present

## 2022-01-02 DIAGNOSIS — Z13220 Encounter for screening for lipoid disorders: Secondary | ICD-10-CM | POA: Diagnosis not present

## 2022-01-02 DIAGNOSIS — Z131 Encounter for screening for diabetes mellitus: Secondary | ICD-10-CM | POA: Diagnosis not present

## 2023-01-08 DIAGNOSIS — K589 Irritable bowel syndrome without diarrhea: Secondary | ICD-10-CM | POA: Diagnosis not present

## 2023-01-08 DIAGNOSIS — Z86010 Personal history of colonic polyps: Secondary | ICD-10-CM | POA: Diagnosis not present

## 2023-01-08 DIAGNOSIS — Z0000 Encounter for general adult medical examination without abnormal findings: Secondary | ICD-10-CM | POA: Diagnosis not present

## 2023-01-08 DIAGNOSIS — E7800 Pure hypercholesterolemia, unspecified: Secondary | ICD-10-CM | POA: Diagnosis not present

## 2023-01-08 DIAGNOSIS — Z125 Encounter for screening for malignant neoplasm of prostate: Secondary | ICD-10-CM | POA: Diagnosis not present

## 2023-04-11 ENCOUNTER — Other Ambulatory Visit: Payer: Self-pay | Admitting: Physician Assistant

## 2023-04-11 ENCOUNTER — Ambulatory Visit
Admission: RE | Admit: 2023-04-11 | Discharge: 2023-04-11 | Disposition: A | Discharge status: Home / Self Care | Payer: BC Managed Care – PPO | Source: Ambulatory Visit | Attending: Physician Assistant | Admitting: Physician Assistant

## 2023-04-11 ENCOUNTER — Encounter (HOSPITAL_COMMUNITY): Payer: Self-pay

## 2023-04-11 ENCOUNTER — Emergency Department (HOSPITAL_COMMUNITY)
Admission: EM | Admit: 2023-04-11 | Discharge: 2023-04-11 | Disposition: A | Discharge status: Home / Self Care | Payer: BC Managed Care – PPO | Attending: Emergency Medicine | Admitting: Emergency Medicine

## 2023-04-11 DIAGNOSIS — R634 Abnormal weight loss: Secondary | ICD-10-CM

## 2023-04-11 DIAGNOSIS — R195 Other fecal abnormalities: Secondary | ICD-10-CM | POA: Diagnosis not present

## 2023-04-11 DIAGNOSIS — R109 Unspecified abdominal pain: Secondary | ICD-10-CM

## 2023-04-11 DIAGNOSIS — R1031 Right lower quadrant pain: Secondary | ICD-10-CM

## 2023-04-11 DIAGNOSIS — R194 Change in bowel habit: Secondary | ICD-10-CM | POA: Diagnosis not present

## 2023-04-11 DIAGNOSIS — K5900 Constipation, unspecified: Secondary | ICD-10-CM | POA: Diagnosis not present

## 2023-04-11 LAB — URINALYSIS, ROUTINE W REFLEX MICROSCOPIC
Bacteria, UA: NONE SEEN
Bilirubin Urine: NEGATIVE
Glucose, UA: NEGATIVE mg/dL
Ketones, ur: 80 mg/dL — AB
Leukocytes,Ua: NEGATIVE
Nitrite: NEGATIVE
Protein, ur: NEGATIVE mg/dL
Specific Gravity, Urine: 1.013 (ref 1.005–1.030)
pH: 6 (ref 5.0–8.0)

## 2023-04-11 LAB — COMPREHENSIVE METABOLIC PANEL
ALT: 25 U/L (ref 0–44)
AST: 31 U/L (ref 15–41)
Albumin: 4 g/dL (ref 3.5–5.0)
Alkaline Phosphatase: 49 U/L (ref 38–126)
Anion gap: 9 (ref 5–15)
BUN: 8 mg/dL (ref 8–23)
CO2: 27 mmol/L (ref 22–32)
Calcium: 9.5 mg/dL (ref 8.9–10.3)
Chloride: 103 mmol/L (ref 98–111)
Creatinine, Ser: 0.81 mg/dL (ref 0.61–1.24)
GFR, Estimated: >60 mL/min (ref >60)
Glucose, Bld: 127 mg/dL — ABNORMAL HIGH (ref 70–99)
Potassium: 4 mmol/L (ref 3.5–5.1)
Sodium: 139 mmol/L (ref 135–145)
Total Bilirubin: 1.1 mg/dL (ref 0.3–1.2)
Total Protein: 6.8 g/dL (ref 6.5–8.1)

## 2023-04-11 LAB — CBC WITH DIFFERENTIAL/PLATELET
Abs Immature Granulocytes: 0.01 10*3/uL (ref 0.00–0.07)
Basophils Absolute: 0 10*3/uL (ref 0.0–0.1)
Basophils Relative: 1 %
Eosinophils Absolute: 0.1 10*3/uL (ref 0.0–0.5)
Eosinophils Relative: 1 %
HCT: 41.3 % (ref 39.0–52.0)
Hemoglobin: 14 g/dL (ref 13.0–17.0)
Immature Granulocytes: 0 %
Lymphocytes Relative: 22 %
Lymphs Abs: 1.1 10*3/uL (ref 0.7–4.0)
MCH: 32.7 pg (ref 26.0–34.0)
MCHC: 33.9 g/dL (ref 30.0–36.0)
MCV: 96.5 fL (ref 80.0–100.0)
Monocytes Absolute: 0.4 10*3/uL (ref 0.1–1.0)
Monocytes Relative: 8 %
Neutro Abs: 3.3 10*3/uL (ref 1.7–7.7)
Neutrophils Relative %: 68 %
Platelets: 196 10*3/uL (ref 150–400)
RBC: 4.28 MIL/uL (ref 4.22–5.81)
RDW: 12.8 % (ref 11.5–15.5)
WBC: 4.8 10*3/uL (ref 4.0–10.5)
nRBC: 0 % (ref 0.0–0.2)

## 2023-04-11 LAB — LIPASE, BLOOD: Lipase: 29 U/L (ref 11–51)

## 2023-04-11 NOTE — ED Provider Notes (Signed)
[place] EMERGENCY DEPARTMENT AT [facility] Provider Note   CSN: [id number] Arrival date & time: 04/11/23  1325     History  Chief Complaint  Patient presents with   Abdominal Pain    JOBE CRISTE is a 62 y.o. male.  Patient here because outpatient CT scan thought may be appendicitis.  Has been having abdominal pain on and off for several months with constipation.  He had some mucousy stool.  He denies any active pain or fever or chills.  No nausea vomiting diarrhea.  The history is provided by the patient.       Home Medications Prior to Admission medications   Medication Sig Start Date End Date Taking? Authorizing Provider  acetaminophen (TYLENOL) 325 MG tablet Take 325 mg by mouth every 6 (six) hours as needed for headache. For pain     [provider]  Probiotic Product (PROBIOTIC DAILY PO) Take 1 tablet by mouth daily.    [provider]  pseudoephedrine-acetaminophen (TYLENOL SINUS) 30-500 MG TABS Take 1 tablet by mouth every 4 (four) hours as needed. For allergies    [provider]      Allergies    Contrast media [iodinated contrast media]    Review of Systems   Review of Systems  Physical Exam Updated Vital Signs BP 138/82 (BP Location: Right Arm)   Pulse 65   Temp 98 F (36.7 C) (Oral)   Resp 13   Ht 6' (1.829 m)   Wt 64.9 kg   SpO2 98%   BMI 19.39 kg/m  Physical Exam Vitals and nursing note reviewed.  Constitutional:      General: He is not in acute distress.    Appearance: He is well-developed.  HENT:     Head: Normocephalic and atraumatic.  Eyes:     Extraocular Movements: Extraocular movements intact.     Conjunctiva/sclera: Conjunctivae normal.  Cardiovascular:     Rate and Rhythm: Normal rate and regular rhythm.     Heart sounds: Normal heart sounds. No murmur heard. Pulmonary:     Effort: Pulmonary effort is normal. No respiratory distress.     Breath sounds: Normal breath sounds.   Abdominal:     Palpations: Abdomen is soft.     Tenderness: There is abdominal tenderness in the right lower quadrant. There is no guarding or rebound. Negative signs include Rovsing's sign.  Musculoskeletal:        General: No swelling.     Cervical back: Neck supple.  Skin:    General: Skin is warm and dry.     Capillary Refill: Capillary refill takes less than 2 seconds.  Neurological:     Mental Status: He is alert.  Psychiatric:        Mood and Affect: Mood normal.     ED Results / Procedures / Treatments   Labs (all labs ordered are listed, but only abnormal results are displayed) Labs Reviewed  COMPREHENSIVE METABOLIC PANEL - Abnormal; Notable for the following components:      Result Value   Glucose, Bld 127 (*)    All other components within normal limits  URINALYSIS, ROUTINE W REFLEX MICROSCOPIC - Abnormal; Notable for the following components:   Hgb urine dipstick SMALL (*)    Ketones, ur 80 (*)    All other components within normal limits  CBC WITH DIFFERENTIAL/PLATELET  LIPASE, BLOOD    EKG None  Radiology CT ABDOMEN PELVIS WO CONTRAST  Result Date: 04/11/2023 CLINICAL DATA:  Constipation and diarrhea. EXAM: CT ABDOMEN AND PELVIS WITHOUT CONTRAST TECHNIQUE: Multidetector CT imaging of the abdomen and pelvis was performed following the standard protocol without IV contrast. RADIATION DOSE REDUCTION: This exam was performed according to the departmental dose-optimization program which includes automated exposure control, adjustment of the mA and/or kV according to patient size and/or use of iterative reconstruction technique. COMPARISON:  CT abdomen 12/05/2012 FINDINGS: Lower chest: Unremarkable. Hepatobiliary: No suspicious focal abnormality in the liver on this study without intravenous contrast. There is no evidence for gallstones, gallbladder wall thickening, or pericholecystic fluid. No intrahepatic or extrahepatic biliary dilation. Pancreas: No focal mass  lesion. No dilatation of the main duct. No intraparenchymal cyst. No peripancreatic edema. Spleen: No splenomegaly. No focal mass lesion. Adrenals/Urinary Tract: No adrenal nodule or mass. Tiny water density lesion lower interpolar right kidney is too small to characterize but is likely a cyst. No followup imaging is recommended. Left kidney unremarkable. No evidence for hydroureter. The urinary bladder appears normal for the degree of distention. Stomach/Bowel: Stomach is unremarkable. No gastric wall thickening. No evidence of outlet obstruction. Duodenum is normally positioned as is the ligament of Treitz. No small bowel wall thickening. No small bowel dilatation. The terminal ileum is normal. The appendix is identified in the low anterior right pelvis just deep to the anterior peritoneum) (axial 65/2 and coronal 30/3. Lumen appears fluid-filled and appendix has a straightened, elongated course. Diameter is 10 mm. No gross colonic mass. No colonic wall thickening. Vascular/Lymphatic: There is mild atherosclerotic calcification of the abdominal aorta without aneurysm. There is no gastrohepatic or hepatoduodenal ligament lymphadenopathy. No retroperitoneal or mesenteric lymphadenopathy. No pelvic sidewall lymphadenopathy. Reproductive: The prostate gland and seminal vesicles are unremarkable. Other: No intraperitoneal free fluid. Musculoskeletal: No worrisome lytic or sclerotic osseous abnormality. IMPRESSION: 1. The appendix is identified in the low anterior right pelvis just deep to the anterior peritoneum. Lumen appears fluid-filled and appendix has a straightened, elongated course. Diameter is 10 mm. While there is not a substantial amount of periappendiceal edema or inflammation, imaging features raise concern for acute appendicitis. No evidence for perforation or abscess. 2. No other acute findings in the abdomen or pelvis. 3.  Aortic Atherosclerosis (ICD10-I70.0). Electronically Signed   By: Kennith Center  M.D.   On: 04/11/2023 11:53    Procedures Procedures    Medications Ordered in ED Medications - No data to display  ED Course/ Medical Decision Making/ A&P                             Medical Decision Making  MASTON VANETTEN is here with abnormal CT scan.  Normal vitals.  No fever.  No significant medical history.  He had a CT scan ordered today for primary care doctor as has been having some chronic abdominal pain the last few weeks.  Mostly right-sided but with some constipation symptoms and mucousy stools.  CT scan showed some abnormalities around the appendix.  Is not particularly tender on exam.  Maybe some very mild tenderness in the right lower quadrant but there is no guarding or rebound.  I talked with Dr. Derrell Lolling with general surgery who reviewed radiology film and ultimately clinically does not seem that he has appendicitis and CT scan per general surgery does not appear consistent with appendicitis either.  Patient to be discharged and will have him return if he has worsening symptoms.  Overall recommend he follow-up with his GI doctor for  his chronic pain.  Lab work otherwise unremarkable.  No significant anemia or electrolyte abnormality or kidney injury.  This chart was dictated using voice recognition software.  Despite best efforts to proofread,  errors can occur which can change the documentation meaning.         Final Clinical Impression(s) / ED Diagnoses Final diagnoses:  Abdominal pain, unspecified abdominal location    Rx / DC Orders ED Discharge Orders     None         Virgina NorfolkCuratolo, Srihitha Tagliaferri, DO 04/11/23 2207

## 2023-04-11 NOTE — Discharge Instructions (Signed)
Follow-up with your neurologist.  Neurosurgery team does not think that this is appendicitis.  Please return if you develop fever and more severe pain.

## 2023-04-11 NOTE — ED Triage Notes (Signed)
Pt went to doctor today for worsening stomach and bowel issues; was told he had appendicitis, sent to ED for further management; states had has had both constipation and diarrhea; endorses RLQ aching intermittently; denies fevers; denies pain currently

## 2023-04-11 NOTE — ED Provider Triage Note (Signed)
Emergency Medicine Provider Triage Evaluation Note  Jason Richardson , a 62 y.o. male  was evaluated in triage.  Pt complains of abd pain. Recurrent abdominal discomfort over the year.  Noticing orange color stool for the past few days.  Seen by PCP today, had CT scan showing appendicitis. No fever, no n/v/d, dysuria  Review of Systems  Positive: As above Negative: As above  Physical Exam  BP (!) 147/76 (BP Location: Right Arm)   Pulse 82   Temp 98.5 F (36.9 C)   Resp 18   Ht 6' (1.829 m)   Wt 64.9 kg   SpO2 100%   BMI 19.39 kg/m  Gen:   Awake, no distress   Resp:  Normal effort  MSK:   Moves extremities without difficulty  Other:    Medical Decision Making  Medically screening exam initiated at 2:43 PM.  Appropriate orders placed.  Jason Richardson was informed that the remainder of the evaluation will be completed by another provider, this initial triage assessment does not replace that evaluation, and the importance of remaining in the ED until their evaluation is complete.     Jason Helper, PA-C 04/11/23 1447

## 2023-09-24 DIAGNOSIS — H2513 Age-related nuclear cataract, bilateral: Secondary | ICD-10-CM | POA: Diagnosis not present

## 2023-09-24 DIAGNOSIS — H524 Presbyopia: Secondary | ICD-10-CM | POA: Diagnosis not present

## 2023-09-24 DIAGNOSIS — H43813 Vitreous degeneration, bilateral: Secondary | ICD-10-CM | POA: Diagnosis not present

## 2023-09-24 DIAGNOSIS — H5213 Myopia, bilateral: Secondary | ICD-10-CM | POA: Diagnosis not present

## 2024-01-14 DIAGNOSIS — E7800 Pure hypercholesterolemia, unspecified: Secondary | ICD-10-CM | POA: Diagnosis not present

## 2024-01-14 DIAGNOSIS — Z0000 Encounter for general adult medical examination without abnormal findings: Secondary | ICD-10-CM | POA: Diagnosis not present

## 2024-01-14 DIAGNOSIS — Z125 Encounter for screening for malignant neoplasm of prostate: Secondary | ICD-10-CM | POA: Diagnosis not present

## 2024-01-14 DIAGNOSIS — Z8719 Personal history of other diseases of the digestive system: Secondary | ICD-10-CM | POA: Diagnosis not present

## 2024-01-14 DIAGNOSIS — L989 Disorder of the skin and subcutaneous tissue, unspecified: Secondary | ICD-10-CM | POA: Diagnosis not present

## 2024-03-20 ENCOUNTER — Other Ambulatory Visit: Payer: Self-pay

## 2024-03-20 ENCOUNTER — Emergency Department (HOSPITAL_COMMUNITY)
Admission: EM | Admit: 2024-03-20 | Discharge: 2024-03-20 | Disposition: A | Discharge status: Home / Self Care | Attending: Emergency Medicine | Admitting: Emergency Medicine

## 2024-03-20 ENCOUNTER — Emergency Department (HOSPITAL_COMMUNITY)

## 2024-03-20 DIAGNOSIS — R109 Unspecified abdominal pain: Secondary | ICD-10-CM

## 2024-03-20 DIAGNOSIS — M545 Low back pain, unspecified: Secondary | ICD-10-CM | POA: Insufficient documentation

## 2024-03-20 DIAGNOSIS — R1031 Right lower quadrant pain: Secondary | ICD-10-CM | POA: Diagnosis not present

## 2024-03-20 LAB — URINALYSIS, ROUTINE W REFLEX MICROSCOPIC
Bacteria, UA: NONE SEEN
Bilirubin Urine: NEGATIVE
Glucose, UA: NEGATIVE mg/dL
Ketones, ur: NEGATIVE mg/dL
Leukocytes,Ua: NEGATIVE
Nitrite: NEGATIVE
Protein, ur: NEGATIVE mg/dL
Specific Gravity, Urine: 1.019 (ref 1.005–1.030)
pH: 5 (ref 5.0–8.0)

## 2024-03-20 LAB — COMPREHENSIVE METABOLIC PANEL
ALT: 21 U/L (ref 0–44)
AST: 28 U/L (ref 15–41)
Albumin: 4.1 g/dL (ref 3.5–5.0)
Alkaline Phosphatase: 43 U/L (ref 38–126)
Anion gap: 6 (ref 5–15)
BUN: 9 mg/dL (ref 8–23)
CO2: 26 mmol/L (ref 22–32)
Calcium: 9.1 mg/dL (ref 8.9–10.3)
Chloride: 103 mmol/L (ref 98–111)
Creatinine, Ser: 1.06 mg/dL (ref 0.61–1.24)
GFR, Estimated: >60 mL/min (ref >60)
Glucose, Bld: 111 mg/dL — ABNORMAL HIGH (ref 70–99)
Potassium: 3.6 mmol/L (ref 3.5–5.1)
Sodium: 135 mmol/L (ref 135–145)
Total Bilirubin: 1.4 mg/dL — ABNORMAL HIGH (ref 0.0–1.2)
Total Protein: 6.8 g/dL (ref 6.5–8.1)

## 2024-03-20 LAB — CBC
HCT: 45.5 % (ref 39.0–52.0)
Hemoglobin: 15 g/dL (ref 13.0–17.0)
MCH: 31.6 pg (ref 26.0–34.0)
MCHC: 33 g/dL (ref 30.0–36.0)
MCV: 96 fL (ref 80.0–100.0)
Platelets: 201 10*3/uL (ref 150–400)
RBC: 4.74 MIL/uL (ref 4.22–5.81)
RDW: 13.1 % (ref 11.5–15.5)
WBC: 4.9 10*3/uL (ref 4.0–10.5)
nRBC: 0 % (ref 0.0–0.2)

## 2024-03-20 LAB — LIPASE, BLOOD: Lipase: 26 U/L (ref 11–51)

## 2024-03-20 NOTE — ED Notes (Signed)
 Pt states last food intake was last night around 6 pm. Pt did try to eat some cereal this am but was not able to due to pain. He did have a few sips of water this am as well.

## 2024-03-20 NOTE — Discharge Instructions (Signed)
 If you develop worsening, continued, or recurrent abdominal pain, uncontrolled vomiting, fever, chest or back pain, or any other new/concerning symptoms then return to the ER for evaluation.

## 2024-03-20 NOTE — ED Provider Notes (Signed)
 [place] EMERGENCY DEPARTMENT AT [facility] Provider Note   CSN: [id number] Arrival date & time: 03/20/24  4098     History  Chief Complaint  Patient presents with   Abdominal Pain    Jason Richardson is a 63 y.o. male.  HPI 63 year old male presents with right lower quadrant abdominal pain.  He is had pain in this area on and off for years.  Over the last 2 days it has been stronger.  Pain hurts in his right lower back as well.  No current testicle pain.  When this happens sometimes he can put pressure on his abdomen and will feel better.  He was having pain with any type of bending.  No fevers, vomiting, urinary symptoms.  He does get mucousy stools and last had this 2 days ago but his bowel movements were normal yesterday and he has not had 1 today.  Pain has limited him from going to work and was bad enough that he presented to the ER this morning but states that after being in the waiting room his pain is no longer present.  Home Medications Prior to Admission medications   Medication Sig Start Date End Date Taking? Authorizing Provider  acetaminophen (TYLENOL) 325 MG tablet Take 325 mg by mouth every 6 (six) hours as needed for headache. For pain     [provider]  Probiotic Product (PROBIOTIC DAILY PO) Take 1 tablet by mouth daily.    [provider]  pseudoephedrine-acetaminophen (TYLENOL SINUS) 30-500 MG TABS Take 1 tablet by mouth every 4 (four) hours as needed. For allergies    [provider]      Allergies    Contrast media [iodinated contrast media]    Review of Systems   Review of Systems  Constitutional:  Negative for fever.  Gastrointestinal:  Positive for abdominal pain and diarrhea. Negative for vomiting.  Genitourinary:  Negative for dysuria and testicular pain.    Physical Exam Updated Vital Signs BP 133/83 (BP Location: Left Arm)   Pulse 60   Temp 98.4 F (36.9 C) (Oral)   Resp 18   SpO2 100%  Physical  Exam Vitals and nursing note reviewed.  Constitutional:      General: He is not in acute distress.    Appearance: He is well-developed. He is not ill-appearing or diaphoretic.  HENT:     Head: Normocephalic and atraumatic.  Cardiovascular:     Rate and Rhythm: Normal rate and regular rhythm.     Heart sounds: Normal heart sounds.  Pulmonary:     Effort: Pulmonary effort is normal.  Abdominal:     Palpations: Abdomen is soft.     Tenderness: There is no abdominal tenderness.  Skin:    General: Skin is warm and dry.  Neurological:     Mental Status: He is alert.     ED Results / Procedures / Treatments   Labs (all labs ordered are listed, but only abnormal results are displayed) Labs Reviewed  COMPREHENSIVE METABOLIC PANEL - Abnormal; Notable for the following components:      Result Value   Glucose, Bld 111 (*)    Total Bilirubin 1.4 (*)    All other components within normal limits  URINALYSIS, ROUTINE W REFLEX MICROSCOPIC - Abnormal; Notable for the following components:   Hgb urine dipstick SMALL (*)    All other components within normal limits  LIPASE, BLOOD  CBC    EKG None  Radiology CT ABDOMEN PELVIS  WO CONTRAST Result Date: 03/20/2024 CLINICAL DATA:  Right lower quadrant abdominal pain. EXAM: CT ABDOMEN AND PELVIS WITHOUT CONTRAST TECHNIQUE: Multidetector CT imaging of the abdomen and pelvis was performed following the standard protocol without IV contrast. RADIATION DOSE REDUCTION: This exam was performed according to the departmental dose-optimization program which includes automated exposure control, adjustment of the mA and/or kV according to patient size and/or use of iterative reconstruction technique. COMPARISON:  April 11, 2023. FINDINGS: Lower chest: No acute abnormality. Hepatobiliary: No focal liver abnormality is seen. No gallstones, gallbladder wall thickening, or biliary dilatation. Pancreas: Unremarkable. No pancreatic ductal dilatation or surrounding  inflammatory changes. Spleen: Normal in size without focal abnormality. Adrenals/Urinary Tract: Adrenal glands appear normal. Stable probable right renal cyst. No hydronephrosis or renal obstruction is noted. Urinary bladder is unremarkable. Stomach/Bowel: Stomach is within normal limits. Appendix appears normal. No evidence of bowel wall thickening, distention, or inflammatory changes. Vascular/Lymphatic: Aortic atherosclerosis. No enlarged abdominal or pelvic lymph nodes. Reproductive: Prostate is unremarkable. Other: No abdominal wall hernia or abnormality. No abdominopelvic ascites. Musculoskeletal: No acute or significant osseous findings. IMPRESSION: No acute abnormality seen in the abdomen or pelvis. Aortic Atherosclerosis (ICD10-I70.0). Electronically Signed   By: Lupita Raider M.D.   On: 03/20/2024 13:28    Procedures Procedures    Medications Ordered in ED Medications - No data to display  ED Course/ Medical Decision Making/ A&P                                 Medical Decision Making Amount and/or Complexity of Data Reviewed External Data Reviewed: notes. Labs: ordered.    Details: Normal WBC.  No UTI. Radiology: ordered and independent interpretation performed.    Details: No appendicitis   Patient presents with acute on chronic right lower quadrant abdominal pain.  CT does not show any acute abnormality.  He is currently actually asymptomatic.  There is a small amount of blood in his urine though that was present last year as well.  No urinary or testicle symptoms.  Unclear cause and he is currently asymptomatic with a negative workup so I think he is stable for outpatient follow-up and have recommended he call his GP.  Stable for discharge with return precautions.        Final Clinical Impression(s) / ED Diagnoses Final diagnoses:  Right sided abdominal pain    Rx / DC Orders ED Discharge Orders     None         Pricilla Loveless, MD 03/20/24 1350

## 2024-03-20 NOTE — ED Triage Notes (Signed)
 Patient reports intermittent right low abdominal pain radiating to lower back for several weeks , no emesis or diarrhea .

## 2024-10-28 DIAGNOSIS — H43813 Vitreous degeneration, bilateral: Secondary | ICD-10-CM | POA: Diagnosis not present

## 2024-10-28 DIAGNOSIS — H2513 Age-related nuclear cataract, bilateral: Secondary | ICD-10-CM | POA: Diagnosis not present

## 2024-10-28 DIAGNOSIS — H5213 Myopia, bilateral: Secondary | ICD-10-CM | POA: Diagnosis not present

## 2024-10-28 DIAGNOSIS — H524 Presbyopia: Secondary | ICD-10-CM | POA: Diagnosis not present
